# Patient Record
Sex: Female | Born: 1995 | Hispanic: Yes | Marital: Single | Smoking: Never smoker
Health system: Southern US, Community
[De-identification: ages and names within clinical notes are randomized; demographics above are authoritative.]

---

## 2017-02-18 ENCOUNTER — Encounter: Payer: Self-pay | Admitting: Internal Medicine

## 2017-02-18 ENCOUNTER — Ambulatory Visit (INDEPENDENT_AMBULATORY_CARE_PROVIDER_SITE_OTHER): Payer: Self-pay | Admitting: Internal Medicine

## 2017-02-18 VITALS — BP 110/60 | HR 76 | Resp 12 | Ht 62.0 in | Wt 119.0 lb

## 2017-02-18 DIAGNOSIS — N898 Other specified noninflammatory disorders of vagina: Secondary | ICD-10-CM

## 2017-02-18 DIAGNOSIS — F419 Anxiety disorder, unspecified: Secondary | ICD-10-CM

## 2017-02-18 DIAGNOSIS — R682 Dry mouth, unspecified: Secondary | ICD-10-CM

## 2017-02-18 DIAGNOSIS — R739 Hyperglycemia, unspecified: Secondary | ICD-10-CM

## 2017-02-18 DIAGNOSIS — R3915 Urgency of urination: Secondary | ICD-10-CM

## 2017-02-18 LAB — POCT WET PREP WITH KOH
CLUE CELLS WET PREP PER HPF POC: NEGATIVE
KOH Prep POC: NEGATIVE
TRICHOMONAS UA: NEGATIVE
Yeast Wet Prep HPF POC: NEGATIVE

## 2017-02-18 LAB — GLUCOSE, POCT (MANUAL RESULT ENTRY): POC Glucose: 106 mg/dl — AB (ref 70–99)

## 2017-02-18 NOTE — Progress Notes (Signed)
Subjective:    Patient ID: Cathy Pena, female    DOB: 1996/02/18, 21 y.o.   MRN: [id number]  HPI   Here to establish  1.  Concern for DM:  Dry mouth and frequent urination since January.  Was tested at school as well and no abnormality found on UA or urine culture.   Urinary urge hourly, but may not have any urine output.  No burning.  No hematuria. Does get up at night to urinate most nights--generally 3-5 a.m.  Usually has a normal to large urine output then. Never with urinary incontinence. Does also get dry mouth as well.  The latter started more at end of school year in May.  Her eyes may be dry--noted this just a couple of weeks ago.  No redness or itching.  Does not wear contacts. Later, patient shares she only has a smoothie once in the day and really has not been drinking fluids throughout the day.  No caffeinated beverages or soda. No vaginal dryness. Does have some white vaginal discharge without odor.  No itching.   No medications OTC or prescribed.   No rashes.   No joint pains.   Menarche age 21 yo.  Periods never regular.  Periods generally every other month.  Do not seem to occur about the same time during the month.   Last 7 days.  No significant cramping.  1st 3 days with heavier bleeding and then tapers.  No clots. Never pregnant.   2.  Anxiety:  States she has always felt anxious.  Cannot say anything has happened in her life she can relate this to.  No history of depression    Past Medical History:  Diagnosis Date  . Prematurity    Not clear how premature.  May have been on ventilator    History reviewed. No pertinent surgical history.   History reviewed. No pertinent family history.   Social History   Social History  . Marital status: Single    Spouse name: N/A  . Number of children: 0  . Years of education: 8013   Occupational History  . rising sophomore at [employer]     Physics major   Social History Main Topics  . Smoking  status: Never Smoker  . Smokeless tobacco: Never Used  . Alcohol use Yes     Comment: once monthly--never to excess  . Drug use: No  . Sexual activity: Not Currently    Birth control/ protection: Condom     Comment: last was in June 2018.  Has had 2 partners in past year.   Other Topics Concern  . Not on file   Social History Narrative   Originally from [place].   [place]ame [place] age 89 yo.   Legal resident.   [place] in [place] to [place]   Graduated from [employer].   Lives here in [place] with her mother.   Does have a good relationship with her father.     He lives in [place]    Review of Systems     Objective:   Physical Exam Anxious appearing HEENT:  PERRL, EOMI, discs sharp bilaterally, TMs pearly gray throat without injection.  MMM.  No obvious cavities Neck:  Supple, no adenopathy, no thyromegaly Chest:  CTA CV:  RRR with normal S1 and S2, No S3, S4 or murmur.  Radial and DP pulses normal and equal. Abd:  S, NT, No HSM or mass.  + BS.  Difficulty  relaxing abdominal musculature for optimal exam. GU: normal external genitalia with no inflammation.  Scan clear to white vaginal discharge with bit of bloody show--perhaps from insertion of speculum vs. Start of period.  Small cervix without lesion or inflammation  No uterine or adnexal mass or tenderness.       Assessment & Plan:  1.  Constellation of complaints:  Dry mouth, urinary urge and frequency, dry eyes.  Serum glucose nonfasting at 107 today.  Doubt her concern for diabetes.  Encouraged her to drink more fluids during day and cut off soon after dinner.  Return for fasting labs in 1 week:  FLP, CBC, CMP--to come hydrated.  UA then as well--with blood from vaginal exam, holding on that today.  2  Vaginal discharge:  No evidence of infection to cause urinary symptoms.  Sending GC/chlamydia.  3.  Anxiety:  Will have T. Aurther Lofterry, LCSWA call her next week for evaluation.

## 2017-02-20 LAB — GC/CHLAMYDIA PROBE AMP
Chlamydia trachomatis, NAA: NEGATIVE
Neisseria gonorrhoeae by PCR: NEGATIVE

## 2017-02-25 ENCOUNTER — Other Ambulatory Visit (INDEPENDENT_AMBULATORY_CARE_PROVIDER_SITE_OTHER): Payer: Self-pay

## 2017-02-25 DIAGNOSIS — R3915 Urgency of urination: Secondary | ICD-10-CM

## 2017-02-25 DIAGNOSIS — R682 Dry mouth, unspecified: Secondary | ICD-10-CM

## 2017-02-25 DIAGNOSIS — R739 Hyperglycemia, unspecified: Secondary | ICD-10-CM

## 2017-02-25 DIAGNOSIS — Z13220 Encounter for screening for lipoid disorders: Secondary | ICD-10-CM

## 2017-02-25 LAB — POCT URINALYSIS DIPSTICK
Bilirubin, UA: NEGATIVE
Glucose, UA: NEGATIVE
KETONES UA: NEGATIVE
Nitrite, UA: NEGATIVE
PH UA: 6.5 (ref 5.0–8.0)
PROTEIN UA: NEGATIVE
RBC UA: NEGATIVE
SPEC GRAV UA: 1.01 (ref 1.010–1.025)
UROBILINOGEN UA: 0.2 U/dL

## 2017-02-26 LAB — CBC WITH DIFFERENTIAL/PLATELET
BASOS: 0 %
Basophils Absolute: 0 10*3/uL (ref 0.0–0.2)
EOS (ABSOLUTE): 0.1 10*3/uL (ref 0.0–0.4)
Eos: 1 %
HEMOGLOBIN: 12.5 g/dL (ref 11.1–15.9)
Hematocrit: 37.5 % (ref 34.0–46.6)
IMMATURE GRANS (ABS): 0 10*3/uL (ref 0.0–0.1)
Immature Granulocytes: 0 %
LYMPHS ABS: 2 10*3/uL (ref 0.7–3.1)
Lymphs: 32 %
MCH: 27.1 pg (ref 26.6–33.0)
MCHC: 33.3 g/dL (ref 31.5–35.7)
MCV: 81 fL (ref 79–97)
MONOCYTES: 7 %
Monocytes Absolute: 0.5 10*3/uL (ref 0.1–0.9)
NEUTROS ABS: 3.6 10*3/uL (ref 1.4–7.0)
Neutrophils: 60 %
PLATELETS: 236 10*3/uL (ref 150–379)
RBC: 4.61 x10E6/uL (ref 3.77–5.28)
RDW: 15.3 % (ref 12.3–15.4)
WBC: 6.2 10*3/uL (ref 3.4–10.8)

## 2017-02-26 LAB — COMPREHENSIVE METABOLIC PANEL
ALT: 24 IU/L (ref 0–32)
AST: 21 IU/L (ref 0–40)
Albumin/Globulin Ratio: 1.7 (ref 1.2–2.2)
Albumin: 4.3 g/dL (ref 3.5–5.5)
Alkaline Phosphatase: 75 IU/L (ref 39–117)
BILIRUBIN TOTAL: 0.4 mg/dL (ref 0.0–1.2)
BUN/Creatinine Ratio: 14 (ref 9–23)
BUN: 10 mg/dL (ref 6–20)
CHLORIDE: 103 mmol/L (ref 96–106)
CO2: 21 mmol/L (ref 20–29)
Calcium: 9.1 mg/dL (ref 8.7–10.2)
Creatinine, Ser: 0.71 mg/dL (ref 0.57–1.00)
GFR calc non Af Amer: 123 mL/min/{1.73_m2} (ref >59)
GFR, EST AFRICAN AMERICAN: 142 mL/min/{1.73_m2} (ref >59)
GLUCOSE: 83 mg/dL (ref 65–99)
Globulin, Total: 2.6 g/dL (ref 1.5–4.5)
Potassium: 4 mmol/L (ref 3.5–5.2)
Sodium: 139 mmol/L (ref 134–144)
TOTAL PROTEIN: 6.9 g/dL (ref 6.0–8.5)

## 2017-02-26 LAB — LIPID PANEL W/O CHOL/HDL RATIO
CHOLESTEROL TOTAL: 175 mg/dL (ref 100–199)
HDL: 99 mg/dL (ref >39)
LDL Calculated: 66 mg/dL (ref 0–99)
TRIGLYCERIDES: 52 mg/dL (ref 0–149)
VLDL CHOLESTEROL CAL: 10 mg/dL (ref 5–40)

## 2017-02-27 LAB — URINE CULTURE: Organism ID, Bacteria: NO GROWTH

## 2017-03-04 ENCOUNTER — Telehealth: Payer: Self-pay | Admitting: Licensed Clinical Social Worker

## 2017-03-04 NOTE — Telephone Encounter (Signed)
LCSW called pt to follow up after referral for counseling from Dr Delrae Alfred. Pt stated that she is currently in college in [place] but will call LCSW in future if she is back in [place]. LCSW encouraged pt to connect with counseling center at her college.

## 2020-02-19 ENCOUNTER — Emergency Department (HOSPITAL_COMMUNITY)
Admission: EM | Admit: 2020-02-19 | Discharge: 2020-02-20 | Disposition: A | Discharge status: Home / Self Care | Payer: BLUE CROSS/BLUE SHIELD | Attending: Emergency Medicine | Admitting: Emergency Medicine

## 2020-02-19 ENCOUNTER — Emergency Department (HOSPITAL_COMMUNITY): Payer: BLUE CROSS/BLUE SHIELD

## 2020-02-19 ENCOUNTER — Encounter (HOSPITAL_COMMUNITY): Payer: Self-pay | Admitting: Emergency Medicine

## 2020-02-19 DIAGNOSIS — Z5321 Procedure and treatment not carried out due to patient leaving prior to being seen by health care provider: Secondary | ICD-10-CM | POA: Diagnosis not present

## 2020-02-19 DIAGNOSIS — J029 Acute pharyngitis, unspecified: Secondary | ICD-10-CM | POA: Diagnosis present

## 2020-02-19 DIAGNOSIS — Z20822 Contact with and (suspected) exposure to covid-19: Secondary | ICD-10-CM | POA: Diagnosis not present

## 2020-02-19 LAB — GROUP A STREP BY PCR: Group A Strep by PCR: DETECTED — AB

## 2020-02-19 LAB — SARS CORONAVIRUS 2 BY RT PCR (HOSPITAL ORDER, PERFORMED IN ~~LOC~~ HOSPITAL LAB): SARS Coronavirus 2: NEGATIVE

## 2020-02-19 MED ORDER — ACETAMINOPHEN 325 MG PO TABS
650.0000 mg | ORAL_TABLET | Freq: Once | ORAL | Status: AC | PRN
  Administered 2020-02-19: 650 mg via ORAL

## 2020-02-19 NOTE — ED Notes (Signed)
Patient is vaccinated against COVID

## 2020-02-19 NOTE — ED Triage Notes (Signed)
Patient reports fever/sore throat X1 day.

## 2020-02-19 NOTE — ED Notes (Signed)
Pt stated she "got her MyChart results and that she was leaving."

## 2021-06-05 IMAGING — CR DG CHEST 2V
2 series · 2 of 2 positions shown · non-contrast
Comparison: None.

CLINICAL DATA: Fever and sore throat x1 day.

EXAM:
CHEST - 2 VIEW

[chest pa]
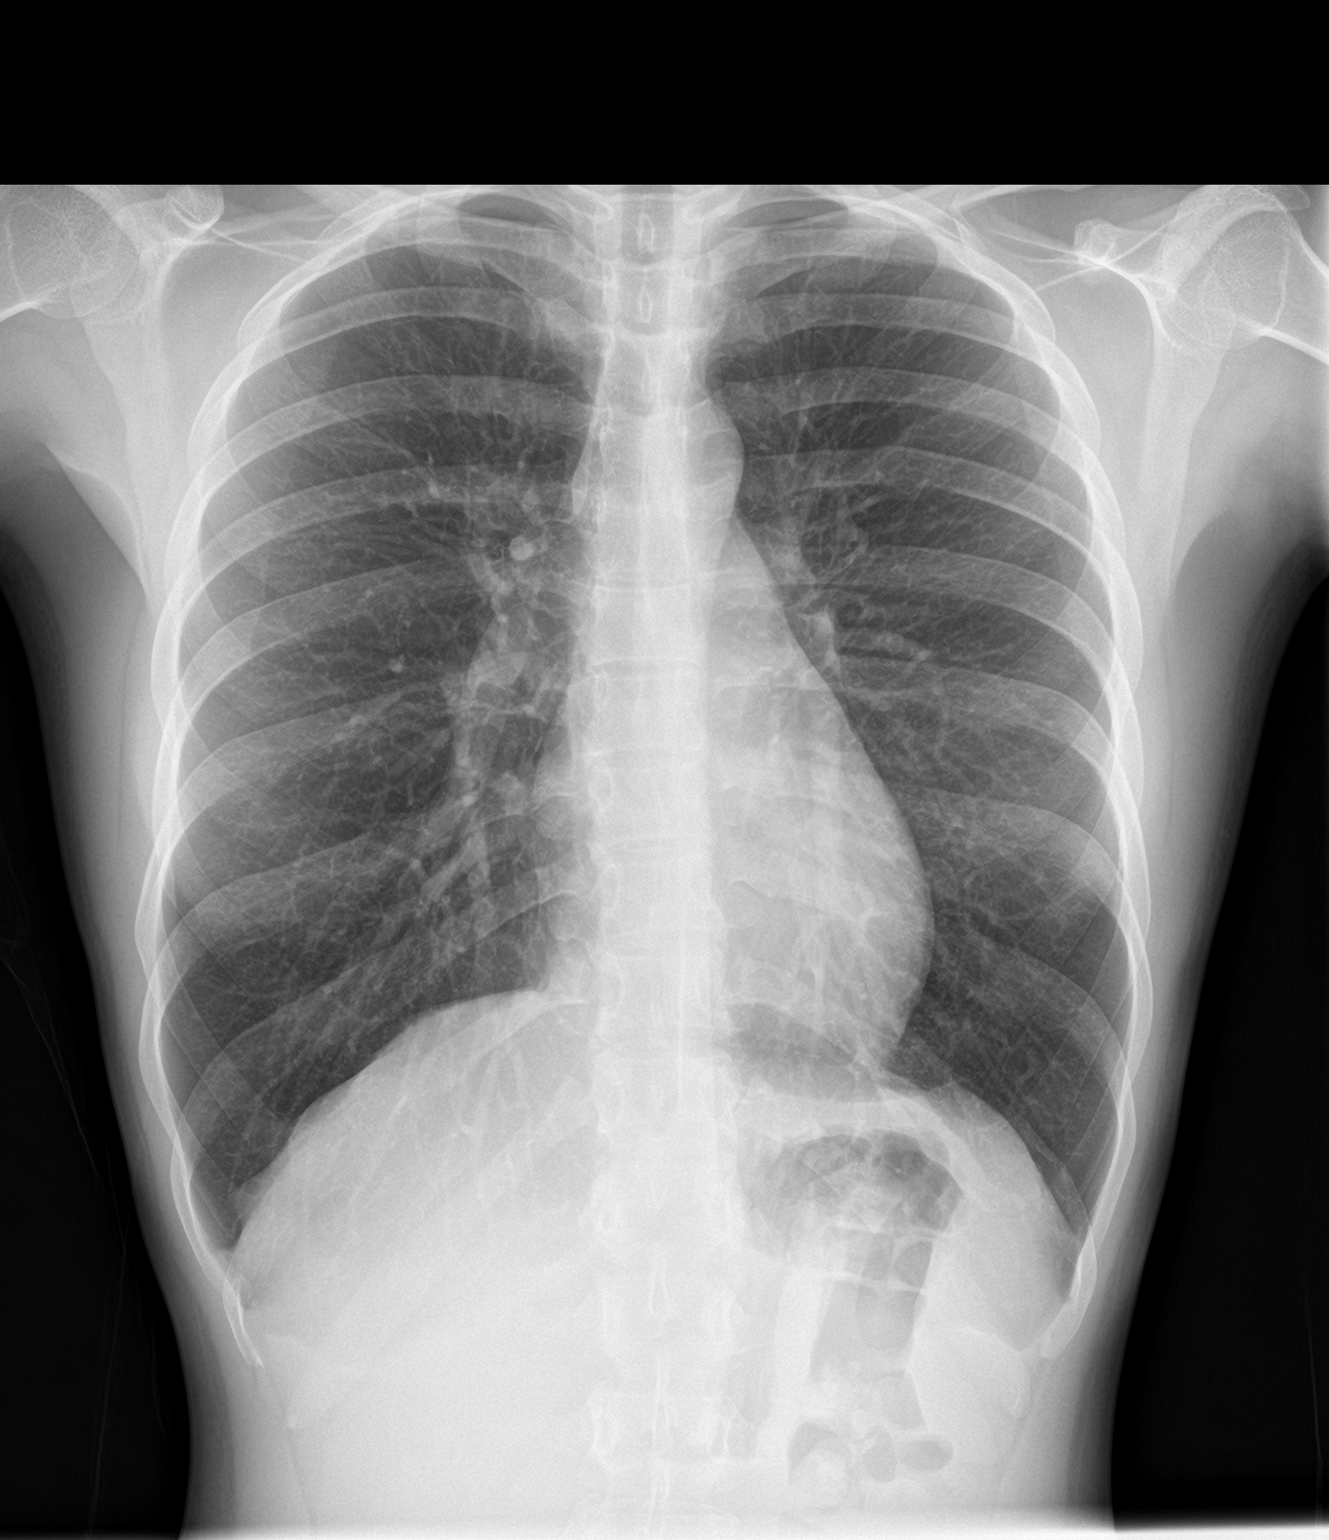

[chest lat]
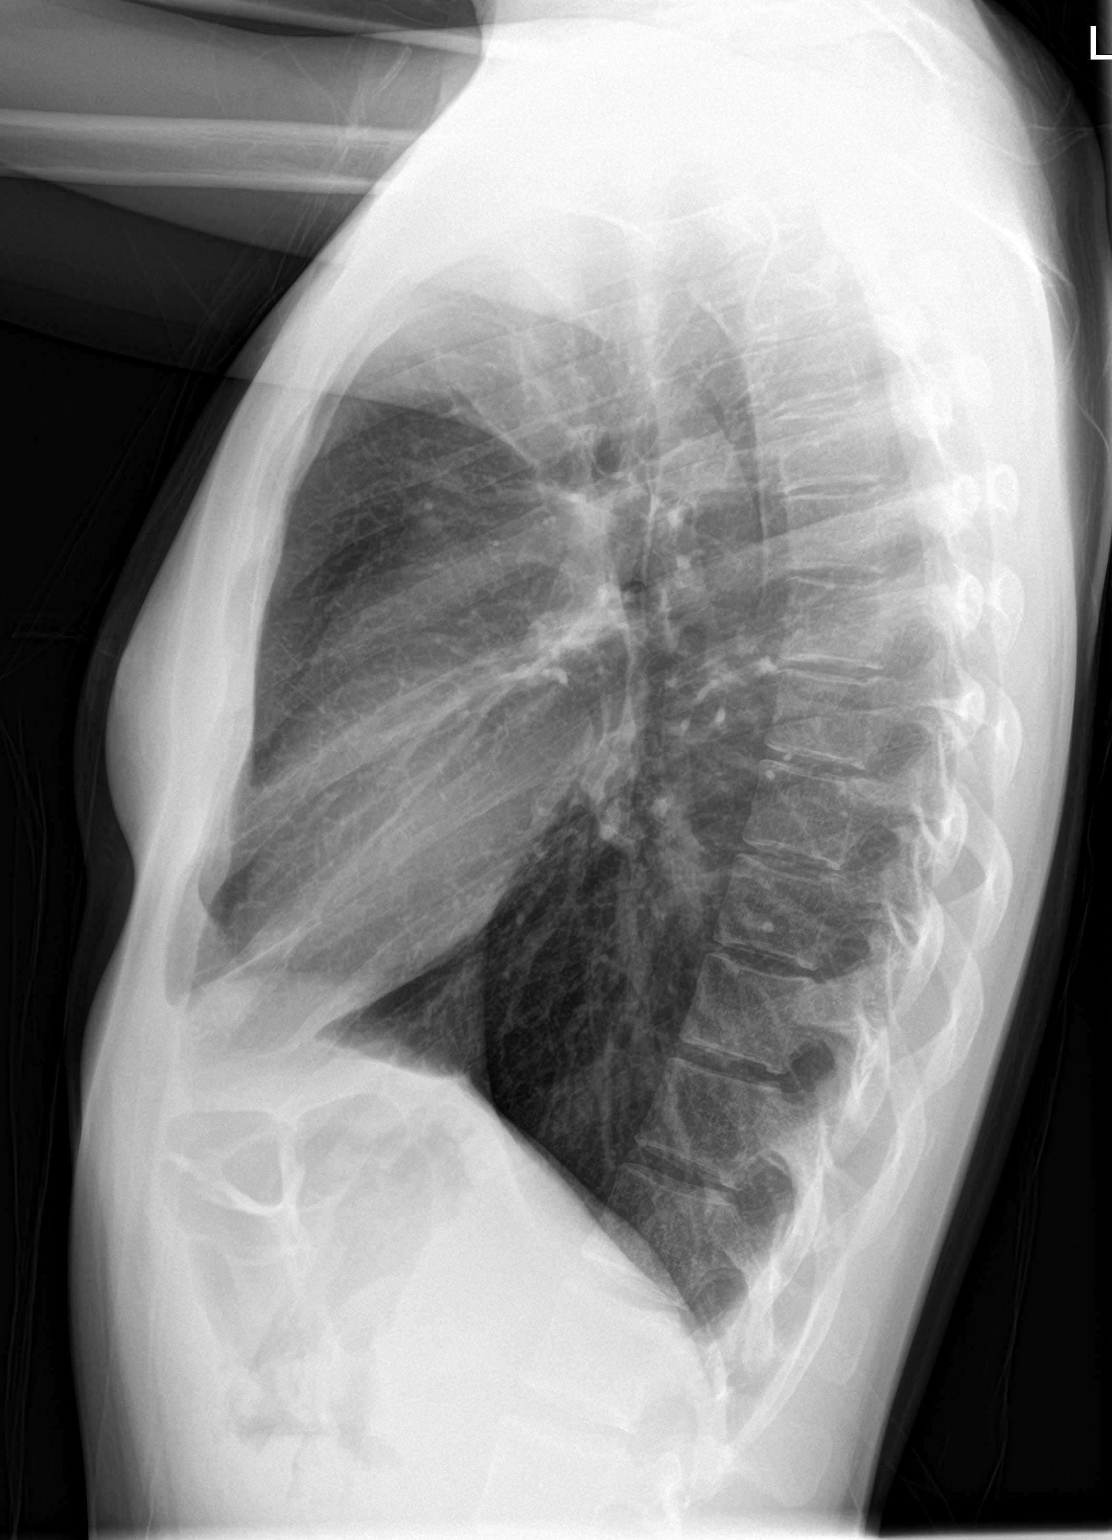

[2 of 2 positions shown; findings below may reference images not displayed]

FINDINGS: There is no evidence of an acute infiltrate, pleural effusion or
pneumothorax. The heart size and mediastinal contours are within
normal limits. The visualized skeletal structures are unremarkable.
IMPRESSION: No active cardiopulmonary disease.

## 2022-03-31 ENCOUNTER — Ambulatory Visit: Payer: Self-pay | Admitting: Internal Medicine
